# Patient Record
Sex: Female | Born: 1965 | Race: White | Hispanic: No | Marital: Married | Smoking: Never smoker
Health system: Southern US, Community
[De-identification: ages and names within clinical notes are randomized; demographics above are authoritative.]

## PROBLEM LIST (undated history)

## (undated) DIAGNOSIS — C801 Malignant (primary) neoplasm, unspecified: Secondary | ICD-10-CM

## (undated) DIAGNOSIS — S52502A Unspecified fracture of the lower end of left radius, initial encounter for closed fracture: Secondary | ICD-10-CM

## (undated) DIAGNOSIS — H8110 Benign paroxysmal vertigo, unspecified ear: Secondary | ICD-10-CM

## (undated) HISTORY — PX: COLONOSCOPY: SHX174

## (undated) HISTORY — PX: REDUCTION MAMMAPLASTY: SUR839

---

## 1984-01-27 HISTORY — PX: REDUCTION MAMMAPLASTY: SUR839

## 1997-01-26 HISTORY — PX: PORTACATH PLACEMENT: SHX2246

## 2016-05-27 ENCOUNTER — Other Ambulatory Visit: Payer: Self-pay | Admitting: Physician Assistant

## 2016-05-27 DIAGNOSIS — Z1231 Encounter for screening mammogram for malignant neoplasm of breast: Secondary | ICD-10-CM

## 2017-11-04 ENCOUNTER — Other Ambulatory Visit: Payer: Self-pay | Admitting: Family Medicine

## 2017-11-04 DIAGNOSIS — Z1231 Encounter for screening mammogram for malignant neoplasm of breast: Secondary | ICD-10-CM

## 2017-12-14 ENCOUNTER — Ambulatory Visit
Admission: RE | Admit: 2017-12-14 | Discharge: 2017-12-14 | Disposition: A | Discharge status: Home / Self Care | Payer: 59 | Source: Ambulatory Visit | Attending: Family Medicine | Admitting: Family Medicine

## 2017-12-14 DIAGNOSIS — Z1231 Encounter for screening mammogram for malignant neoplasm of breast: Secondary | ICD-10-CM

## 2018-04-15 ENCOUNTER — Other Ambulatory Visit: Payer: Self-pay | Admitting: Sports Medicine

## 2018-04-15 DIAGNOSIS — M25532 Pain in left wrist: Secondary | ICD-10-CM

## 2018-04-18 ENCOUNTER — Other Ambulatory Visit: Payer: Self-pay

## 2018-04-18 ENCOUNTER — Ambulatory Visit
Admission: RE | Admit: 2018-04-18 | Discharge: 2018-04-18 | Disposition: A | Discharge status: Home / Self Care | Payer: BLUE CROSS/BLUE SHIELD | Source: Ambulatory Visit | Attending: Sports Medicine | Admitting: Sports Medicine

## 2018-04-18 DIAGNOSIS — M25532 Pain in left wrist: Secondary | ICD-10-CM

## 2018-04-19 ENCOUNTER — Other Ambulatory Visit: Payer: Self-pay

## 2018-04-19 ENCOUNTER — Encounter (HOSPITAL_BASED_OUTPATIENT_CLINIC_OR_DEPARTMENT_OTHER): Payer: Self-pay | Admitting: *Deleted

## 2018-04-20 ENCOUNTER — Ambulatory Visit (HOSPITAL_BASED_OUTPATIENT_CLINIC_OR_DEPARTMENT_OTHER): Payer: BLUE CROSS/BLUE SHIELD | Admitting: Certified Registered"

## 2018-04-20 ENCOUNTER — Ambulatory Visit (HOSPITAL_BASED_OUTPATIENT_CLINIC_OR_DEPARTMENT_OTHER)
Admission: RE | Admit: 2018-04-20 | Discharge: 2018-04-20 | Disposition: A | Discharge status: Home / Self Care | Payer: BLUE CROSS/BLUE SHIELD | Attending: Orthopaedic Surgery | Admitting: Orthopaedic Surgery

## 2018-04-20 ENCOUNTER — Other Ambulatory Visit: Payer: Self-pay

## 2018-04-20 ENCOUNTER — Encounter (HOSPITAL_BASED_OUTPATIENT_CLINIC_OR_DEPARTMENT_OTHER)
Admission: RE | Disposition: A | Discharge status: Home / Self Care | Payer: Self-pay | Source: Home / Self Care | Attending: Orthopaedic Surgery

## 2018-04-20 DIAGNOSIS — Z923 Personal history of irradiation: Secondary | ICD-10-CM | POA: Diagnosis not present

## 2018-04-20 DIAGNOSIS — Z79899 Other long term (current) drug therapy: Secondary | ICD-10-CM | POA: Insufficient documentation

## 2018-04-20 DIAGNOSIS — Z8571 Personal history of Hodgkin lymphoma: Secondary | ICD-10-CM | POA: Insufficient documentation

## 2018-04-20 DIAGNOSIS — Z9221 Personal history of antineoplastic chemotherapy: Secondary | ICD-10-CM | POA: Insufficient documentation

## 2018-04-20 DIAGNOSIS — S52572A Other intraarticular fracture of lower end of left radius, initial encounter for closed fracture: Secondary | ICD-10-CM | POA: Insufficient documentation

## 2018-04-20 DIAGNOSIS — Z791 Long term (current) use of non-steroidal anti-inflammatories (NSAID): Secondary | ICD-10-CM | POA: Diagnosis not present

## 2018-04-20 HISTORY — DX: Benign paroxysmal vertigo, unspecified ear: H81.10

## 2018-04-20 HISTORY — DX: Malignant (primary) neoplasm, unspecified: C80.1

## 2018-04-20 HISTORY — DX: Unspecified fracture of the lower end of left radius, initial encounter for closed fracture: S52.502A

## 2018-04-20 HISTORY — PX: OPEN REDUCTION INTERNAL FIXATION (ORIF) DISTAL RADIAL FRACTURE: SHX5989

## 2018-04-20 SURGERY — OPEN REDUCTION INTERNAL FIXATION (ORIF) DISTAL RADIUS FRACTURE
Anesthesia: General | Site: Wrist | Laterality: Left

## 2018-04-20 MED ORDER — PROMETHAZINE HCL 25 MG/ML IJ SOLN: 6.2500 mg | INTRAMUSCULAR | Status: DC | PRN

## 2018-04-20 MED ORDER — CEFAZOLIN SODIUM-DEXTROSE 2-4 GM/100ML-% IV SOLN: 2.0000 g | INTRAVENOUS | Status: DC

## 2018-04-20 MED ORDER — LACTATED RINGERS IV SOLN
INTRAVENOUS | Status: DC
  Administered 2018-04-20: 10 mL/h via INTRAVENOUS
  Administered 2018-04-20: 10:00:00 via INTRAVENOUS

## 2018-04-20 MED ORDER — ONDANSETRON HCL 4 MG/2ML IJ SOLN
INTRAMUSCULAR | Status: AC
  Filled 2018-04-20: qty 2

## 2018-04-20 MED ORDER — MIDAZOLAM HCL 2 MG/2ML IJ SOLN
INTRAMUSCULAR | Status: AC
  Filled 2018-04-20: qty 2

## 2018-04-20 MED ORDER — PHENYLEPHRINE HCL 10 MG/ML IJ SOLN
INTRAMUSCULAR | Status: DC | PRN
  Administered 2018-04-20: 80 ug via INTRAVENOUS
  Administered 2018-04-20: 120 ug via INTRAVENOUS
  Administered 2018-04-20: 80 ug via INTRAVENOUS
  Administered 2018-04-20: 120 ug via INTRAVENOUS

## 2018-04-20 MED ORDER — OXYCODONE HCL 5 MG PO TABS: 5.0000 mg | ORAL_TABLET | Freq: Once | ORAL | Status: DC | PRN

## 2018-04-20 MED ORDER — FENTANYL CITRATE (PF) 100 MCG/2ML IJ SOLN
50.0000 ug | INTRAMUSCULAR | Status: DC | PRN
  Administered 2018-04-20: 25 ug via INTRAVENOUS
  Administered 2018-04-20: 100 ug via INTRAVENOUS

## 2018-04-20 MED ORDER — CEFAZOLIN SODIUM-DEXTROSE 2-3 GM-%(50ML) IV SOLR
INTRAVENOUS | Status: DC | PRN
  Administered 2018-04-20: 2 g via INTRAVENOUS

## 2018-04-20 MED ORDER — MEPERIDINE HCL 25 MG/ML IJ SOLN: 6.2500 mg | INTRAMUSCULAR | Status: DC | PRN

## 2018-04-20 MED ORDER — VANCOMYCIN HCL 1000 MG IV SOLR
INTRAVENOUS | Status: DC | PRN
  Administered 2018-04-20: 1000 mg

## 2018-04-20 MED ORDER — FENTANYL CITRATE (PF) 100 MCG/2ML IJ SOLN
INTRAMUSCULAR | Status: AC
  Filled 2018-04-20: qty 2

## 2018-04-20 MED ORDER — CHLORHEXIDINE GLUCONATE 4 % EX LIQD: 60.0000 mL | Freq: Once | CUTANEOUS | Status: DC

## 2018-04-20 MED ORDER — LIDOCAINE 2% (20 MG/ML) 5 ML SYRINGE
INTRAMUSCULAR | Status: AC
  Filled 2018-04-20: qty 5

## 2018-04-20 MED ORDER — ACETAMINOPHEN 500 MG PO TABS: 1000.0000 mg | ORAL_TABLET | Freq: Three times a day (TID) | ORAL | 0 refills | Status: AC

## 2018-04-20 MED ORDER — OXYCODONE HCL 5 MG/5ML PO SOLN: 5.0000 mg | Freq: Once | ORAL | Status: DC | PRN

## 2018-04-20 MED ORDER — OXYCODONE HCL 5 MG PO TABS: ORAL_TABLET | ORAL | 0 refills | Status: AC

## 2018-04-20 MED ORDER — PROPOFOL 10 MG/ML IV BOLUS
INTRAVENOUS | Status: AC
  Filled 2018-04-20: qty 40

## 2018-04-20 MED ORDER — HYDROMORPHONE HCL 1 MG/ML IJ SOLN: 0.2500 mg | INTRAMUSCULAR | Status: DC | PRN

## 2018-04-20 MED ORDER — MIDAZOLAM HCL 2 MG/2ML IJ SOLN
1.0000 mg | INTRAMUSCULAR | Status: DC | PRN
  Administered 2018-04-20: 2 mg via INTRAVENOUS

## 2018-04-20 MED ORDER — DEXAMETHASONE SODIUM PHOSPHATE 10 MG/ML IJ SOLN
INTRAMUSCULAR | Status: DC | PRN
  Administered 2018-04-20: 10 mg via INTRAVENOUS

## 2018-04-20 MED ORDER — ROPIVACAINE HCL 5 MG/ML IJ SOLN
INTRAMUSCULAR | Status: DC | PRN
  Administered 2018-04-20: 30 mL via PERINEURAL

## 2018-04-20 MED ORDER — DEXAMETHASONE SODIUM PHOSPHATE 10 MG/ML IJ SOLN
INTRAMUSCULAR | Status: AC
  Filled 2018-04-20: qty 1

## 2018-04-20 MED ORDER — CEFAZOLIN SODIUM-DEXTROSE 2-4 GM/100ML-% IV SOLN
INTRAVENOUS | Status: AC
  Filled 2018-04-20: qty 100

## 2018-04-20 MED ORDER — ONDANSETRON HCL 4 MG/2ML IJ SOLN
INTRAMUSCULAR | Status: DC | PRN
  Administered 2018-04-20: 4 mg via INTRAVENOUS

## 2018-04-20 MED ORDER — LIDOCAINE HCL (CARDIAC) PF 100 MG/5ML IV SOSY
PREFILLED_SYRINGE | INTRAVENOUS | Status: DC | PRN
  Administered 2018-04-20: 100 mg via INTRAVENOUS

## 2018-04-20 MED ORDER — ONDANSETRON HCL 4 MG PO TABS: 4.0000 mg | ORAL_TABLET | Freq: Three times a day (TID) | ORAL | 1 refills | Status: AC | PRN

## 2018-04-20 MED ORDER — PROPOFOL 10 MG/ML IV BOLUS
INTRAVENOUS | Status: DC | PRN
  Administered 2018-04-20: 200 mg via INTRAVENOUS

## 2018-04-20 MED ORDER — EPHEDRINE SULFATE 50 MG/ML IJ SOLN
INTRAMUSCULAR | Status: DC | PRN
  Administered 2018-04-20: 15 mg via INTRAVENOUS

## 2018-04-20 MED ORDER — CELECOXIB 200 MG PO CAPS: 200.0000 mg | ORAL_CAPSULE | Freq: Two times a day (BID) | ORAL | 0 refills | Status: AC

## 2018-04-20 MED ORDER — SCOPOLAMINE 1 MG/3DAYS TD PT72: 1.0000 | MEDICATED_PATCH | Freq: Once | TRANSDERMAL | Status: DC | PRN

## 2018-04-20 SURGICAL SUPPLY — 75 items
BANDAGE ACE 3X5.8 VEL STRL LF (GAUZE/BANDAGES/DRESSINGS) ×3 IMPLANT
BANDAGE ACE 4X5 VEL STRL LF (GAUZE/BANDAGES/DRESSINGS) ×3 IMPLANT
BENZOIN TINCTURE PRP APPL 2/3 (GAUZE/BANDAGES/DRESSINGS) ×3 IMPLANT
BIT DRILL 2.2 SS TIBIAL (BIT) ×3 IMPLANT
BLADE HEX COATED 2.75 (ELECTRODE) ×3 IMPLANT
BLADE SURG 15 STRL LF DISP TIS (BLADE) ×1 IMPLANT
BLADE SURG 15 STRL SS (BLADE) ×2
BNDG COHESIVE 4X5 TAN STRL (GAUZE/BANDAGES/DRESSINGS) ×3 IMPLANT
BNDG ESMARK 4X9 LF (GAUZE/BANDAGES/DRESSINGS) ×3 IMPLANT
BRUSH SCRUB EZ PLAIN DRY (MISCELLANEOUS) ×3 IMPLANT
CANISTER SUCT 1200ML W/VALVE (MISCELLANEOUS) IMPLANT
CHLORAPREP W/TINT 26 (MISCELLANEOUS) IMPLANT
CLOSURE WOUND 1/2 X4 (GAUZE/BANDAGES/DRESSINGS) ×1
COVER BACK TABLE 60X90IN (DRAPES) ×3 IMPLANT
COVER WAND RF STERILE (DRAPES) IMPLANT
CUFF TOURN SGL QUICK 18X4 (TOURNIQUET CUFF) ×3 IMPLANT
DECANTER SPIKE VIAL GLASS SM (MISCELLANEOUS) IMPLANT
DRAPE EXTREMITY T 121X128X90 (DISPOSABLE) ×3 IMPLANT
DRAPE IMP U-DRAPE 54X76 (DRAPES) ×3 IMPLANT
DRAPE INCISE IOBAN 66X45 STRL (DRAPES) IMPLANT
DRAPE OEC MINIVIEW 54X84 (DRAPES) ×3 IMPLANT
DRAPE SURG 17X23 STRL (DRAPES) ×3 IMPLANT
DRSG EMULSION OIL 3X3 NADH (GAUZE/BANDAGES/DRESSINGS) IMPLANT
ELECT REM PT RETURN 9FT ADLT (ELECTROSURGICAL) ×3
ELECTRODE REM PT RTRN 9FT ADLT (ELECTROSURGICAL) ×1 IMPLANT
GAUZE SPONGE 4X4 12PLY STRL (GAUZE/BANDAGES/DRESSINGS) ×3 IMPLANT
GLOVE BIOGEL PI IND STRL 7.0 (GLOVE) ×2 IMPLANT
GLOVE BIOGEL PI IND STRL 8 (GLOVE) ×1 IMPLANT
GLOVE BIOGEL PI INDICATOR 7.0 (GLOVE) ×4
GLOVE BIOGEL PI INDICATOR 8 (GLOVE) ×2
GLOVE ECLIPSE 6.5 STRL STRAW (GLOVE) ×3 IMPLANT
GLOVE ECLIPSE 8.0 STRL XLNG CF (GLOVE) ×3 IMPLANT
GLOVE SURG SS PI 6.5 STRL IVOR (GLOVE) ×3 IMPLANT
GOWN STRL REUS W/ TWL LRG LVL3 (GOWN DISPOSABLE) ×2 IMPLANT
GOWN STRL REUS W/ TWL XL LVL3 (GOWN DISPOSABLE) IMPLANT
GOWN STRL REUS W/TWL LRG LVL3 (GOWN DISPOSABLE) ×4
GOWN STRL REUS W/TWL XL LVL3 (GOWN DISPOSABLE) ×3 IMPLANT
K-WIRE 1.6 (WIRE) ×6
K-WIRE FX5X1.6XNS BN SS (WIRE) ×3
KWIRE FX5X1.6XNS BN SS (WIRE) ×3 IMPLANT
NEEDLE HYPO 25X1 1.5 SAFETY (NEEDLE) IMPLANT
NS IRRIG 1000ML POUR BTL (IV SOLUTION) ×3 IMPLANT
PACK BASIN DAY SURGERY FS (CUSTOM PROCEDURE TRAY) ×3 IMPLANT
PAD CAST 4YDX4 CTTN HI CHSV (CAST SUPPLIES) ×1 IMPLANT
PADDING CAST COTTON 4X4 STRL (CAST SUPPLIES) ×2
PEG LOCKING SMOOTH 2.2X18 (Peg) ×12 IMPLANT
PEG LOCKING SMOOTH 2.2X20 (Screw) ×9 IMPLANT
PEG LOCKING SMOOTH 2.2X22 (Screw) ×3 IMPLANT
PENCIL BUTTON HOLSTER BLD 10FT (ELECTRODE) ×3 IMPLANT
PLATE VOLAR RIM L ST (Plate) ×3 IMPLANT
SCREW 2.7X14MM (Screw) ×2 IMPLANT
SCREW BN 14X2.7XNONLOCK 3 LD (Screw) ×1 IMPLANT
SCREW LOCK 16X2.7X 3 LD TPR (Screw) ×1 IMPLANT
SCREW LOCKING 2.7X15MM (Screw) ×6 IMPLANT
SCREW LOCKING 2.7X16 (Screw) ×2 IMPLANT
SCREW NLOCK 24X2.7 3 LD (Screw) ×1 IMPLANT
SCREW NONLOCK 2.7X24 (Screw) ×2 IMPLANT
SLEEVE SCD COMPRESS KNEE MED (MISCELLANEOUS) ×3 IMPLANT
SLING ARM FOAM STRAP MED (SOFTGOODS) ×3 IMPLANT
SPLINT PLASTER CAST XFAST 3X15 (CAST SUPPLIES) ×10 IMPLANT
SPLINT PLASTER XTRA FASTSET 3X (CAST SUPPLIES) ×20
SPONGE LAP 4X18 RFD (DISPOSABLE) IMPLANT
STRIP CLOSURE SKIN 1/2X4 (GAUZE/BANDAGES/DRESSINGS) ×2 IMPLANT
SUCTION FRAZIER HANDLE 10FR (MISCELLANEOUS) ×2
SUCTION TUBE FRAZIER 10FR DISP (MISCELLANEOUS) ×1 IMPLANT
SUT MNCRL AB 4-0 PS2 18 (SUTURE) ×3 IMPLANT
SUT VIC AB 3-0 SH 27 (SUTURE)
SUT VIC AB 3-0 SH 27X BRD (SUTURE) IMPLANT
SYR BULB 3OZ (MISCELLANEOUS) ×3 IMPLANT
SYR CONTROL 10ML LL (SYRINGE) IMPLANT
TOWEL GREEN STERILE FF (TOWEL DISPOSABLE) ×3 IMPLANT
TRAY DSU PREP LF (CUSTOM PROCEDURE TRAY) ×3 IMPLANT
TUBE CONNECTING 20'X1/4 (TUBING) ×1
TUBE CONNECTING 20X1/4 (TUBING) ×2 IMPLANT
YANKAUER SUCT BULB TIP NO VENT (SUCTIONS) IMPLANT

## 2018-04-20 NOTE — H&P (Signed)
PREOPERATIVE H&P  Chief Complaint: LEFT WRIST FRACTURE  HPI: Ann Singh is a 53 y.o. female who presents for preoperative history and physical with a diagnosis of LEFT WRIST FRACTURE. Symptoms are rated as moderate to severe, and have been worsening.  This is significantly impairing activities of daily living.  Please see my clinic note for full details on this patient's care.  She has elected for surgical management.   Past Medical History:  Diagnosis Date  . BPPV (benign paroxysmal positional vertigo)    corrected with Eply maneuvers  . Cancer [facility])    age 52 diagnosed with Hodgkin's lymphoma-chemo and radiation-no no problems since treatment finished  . Closed fracture of left distal radius happened 04-13-18   Past Surgical History:  Procedure Laterality Date  . COLONOSCOPY    . [place]   for lymphoma  . REDUCTION MAMMAPLASTY Bilateral    in her 20's   Social History   Socioeconomic History  . Marital status: Married    Spouse name: Not on file  . Number of children: Not on file  . Years of education: Not on file  . Highest education level: Not on file  Occupational History  . Not on file  Social Needs  . Financial resource strain: Not on file  . Food insecurity:    Worry: Not on file    Inability: Not on file  . Transportation needs:    Medical: Not on file    Non-medical: Not on file  Tobacco Use  . Smoking status: Never Smoker  . Smokeless tobacco: Never Used  Substance and Sexual Activity  . Alcohol use: Yes    Comment: social  . Drug use: Never  . Sexual activity: Yes    Birth control/protection: Post-menopausal  Lifestyle  . Physical activity:    Days per week: Not on file    Minutes per session: Not on file  . Stress: Not on file  Relationships  . Social connections:    Talks on phone: Not on file    Gets together: Not on file    Attends religious service: Not on file    Active member of club or organization: Not on  file    Attends meetings of clubs or organizations: Not on file    Relationship status: Not on file  Other Topics Concern  . Not on file  Social History Narrative  . Not on file   History reviewed. No pertinent family history. No Known Allergies Prior to Admission medications   Medication Sig Start Date End Date Taking? Authorizing Provider  Biotin 1000 MCG CHEW Chew by mouth.   Yes [provider]  HYDROcodone-acetaminophen (NORCO/VICODIN) 5-325 MG tablet Take 1 tablet by mouth every 6 (six) hours as needed for moderate pain.   Yes [provider]  ibuprofen (ADVIL,MOTRIN) 600 MG tablet Take 600 mg by mouth every 6 (six) hours as needed.   Yes [provider]  meloxicam (MOBIC) 15 MG tablet Take 15 mg by mouth daily.   Yes [provider]  Multiple Vitamin (MULTIVITAMIN WITH MINERALS) TABS tablet Take 1 tablet by mouth daily.   Yes [provider]  vitamin C (ASCORBIC ACID) 500 MG tablet Take 500 mg by mouth daily.   Yes [provider]     Positive ROS: All other systems have been reviewed and were otherwise negative with the exception of those mentioned in the HPI and as above.  Physical Exam: General: Alert, no  acute distress Cardiovascular: No pedal edema Respiratory: No cyanosis, no use of accessory musculature GI: No organomegaly, abdomen is soft and non-tender Skin: No lesions in the area of chief complaint Neurologic: Sensation intact distally Psychiatric: Patient is competent for consent with normal mood and affect Lymphatic: No axillary or cervical lymphadenopathy  MUSCULOSKELETAL: L wrist, ttp around wrist, distal motor and sensory intact,  Assessment: LEFT WRIST FRACTURE  Plan: Plan for Procedure(s): LEFT OPEN REDUCTION INTERNAL FIXATION (ORIF) DISTAL RADIAL FRACTURE  The risks benefits and alternatives were discussed with the patient including but not limited to the risks of nonoperative treatment, versus  surgical intervention including infection, bleeding, nerve injury,  blood clots, cardiopulmonary complications, morbidity, mortality, among others, and they were willing to proceed.   Hiram Gash, MD  04/20/2018 9:14 AM

## 2018-04-20 NOTE — Anesthesia Procedure Notes (Signed)
Anesthesia Regional Block: Supraclavicular block   Pre-Anesthetic Checklist: ,, timeout performed, Correct Patient, Correct Site, Correct Laterality, Correct Procedure, Correct Position, site marked, Risks and benefits discussed,  Surgical consent,  Pre-op evaluation,  At surgeon's request and post-op pain management  Laterality: Left  Prep: chloraprep       Needles:  Injection technique: Single-shot  Needle Type: Stimiplex     Needle Length: 9cm  Needle Gauge: 21     Additional Needles:   Procedures:,,,, ultrasound used (permanent image in chart),,,,  Narrative:  Start time: 04/20/2018 8:45 AM End time: 04/20/2018 8:50 AM Injection made incrementally with aspirations every 5 mL.  Performed by: Personally  Anesthesiologist: Lynda Rainwater, MD

## 2018-04-20 NOTE — Anesthesia Postprocedure Evaluation (Signed)
Anesthesia Post Note  Patient: Kayelyn Lemon Haun Gerads  Procedure(s) Performed: LEFT OPEN REDUCTION INTERNAL FIXATION (ORIF) DISTAL RADIAL FRACTURE (Left Wrist)     Patient location during evaluation: PACU Anesthesia Type: General Level of consciousness: awake and alert Pain management: pain level controlled Vital Signs Assessment: post-procedure vital signs reviewed and stable Respiratory status: spontaneous breathing, nonlabored ventilation and respiratory function stable Cardiovascular status: blood pressure returned to baseline and stable Postop Assessment: no apparent nausea or vomiting Anesthetic complications: no    Last Vitals:  Vitals:   04/20/18 1200 04/20/18 1230  BP: (!) 138/92 (!) 144/89  Pulse: (!) 104 100  Resp: 18 18  Temp:  36.6 C  SpO2: 95% 96%    Last Pain:  Vitals:   04/20/18 1230  TempSrc:   PainSc: 0-No pain                 Lynda Rainwater

## 2018-04-20 NOTE — Op Note (Signed)
Orthopaedic Surgery Operative Note (CSN: [id number])  Ann Singh  May 22, 1965 Date of Surgery: 04/20/2018   Diagnoses:  Left intraarticular distal radius fracture  Procedure: Left 4 part intraarticular distal radius ORIF   Operative Finding Successful completion of planned procedure.    Post-operative plan: The patient will be NWB in splint.  The patient will be DC home.  DVT prophylaxis not indicated in isolated upper extremity surgery patient with no specific risks factors.  Pain control with PRN pain medication preferring oral medicines.  Follow up plan will be scheduled in approximately 7 days for incision check and XR.  Removable splint likely with hand to mouth activities only for 6 weeks.  Post-Op Diagnosis: Same Surgeons:Primary: Hiram Gash, MD Assistants:Brandon Lynnell Jude Location: [facility] OR ROOM 2 Anesthesia: General Antibiotics: Ancef 2g preop, Vancomycin 1000mg  locally  Tourniquet time:  Total Tourniquet Time Documented: Upper Arm (Left) - 55 minutes Total: Upper Arm (Left) - 55 minutes  Estimated Blood Loss:minimal Complications: None Specimens: None Implants: Implant Name Type Inv. Item Serial No. Manufacturer Lot No. LRB No. Used Action  DVR volar rim plate   [id number] BI[employer]ET [employer]TH[employer] [id number] Left 1 Implanted  PEG LOCKING SMOOTH 2.2X22 - [id number] Screw PEG LOCKING SMOOTH 2.2X22  ZIMMER RECON(ORTH,TRAU,BIO,SG) ON SET Left 1 Implanted  PEG LOCKING SMOOTH 2.2X18 - [id number] Peg PEG LOCKING SMOOTH 2.2X18  ZIMMER RECON(ORTH,TRAU,BIO,SG) ON SET Left 4 Implanted  PEG LOCKING SMOOTH 2.2X20 - [id number] Screw PEG LOCKING SMOOTH 2.2X20  ZIMMER RECON(ORTH,TRAU,BIO,SG) ON SET Left 3 Implanted  SCREW LOCKING 2.7X15MM - [id number] Screw SCREW LOCKING 2.7X15MM  ZIMMER RECON(ORTH,TRAU,BIO,SG) ON SET Left 2 Implanted  SCREW LOCKING 2.7X16 - [id number] Screw SCREW LOCKING 2.7X16  ZIMMER RECON(ORTH,TRAU,BIO,SG) ON SET Left 1 Implanted    Indications for Surgery:    Ann Singh is a 53 y.o. female with fall off a hoverboard.  She had a Ct of her wrist demonstrating a 4 part intraarticular distal radius fracture.  Benefits and risks of operative and nonoperative management were discussed prior to surgery with patient/guardian(s) and informed consent form was completed.  Specific risks including infection, need for additional surgery,    Procedure:   The patient was identified in the preoperative holding area where the surgical site was marked. The patient was taken to the OR where a procedural timeout was called and the above noted anesthesia was induced.  The patient was positioned supine on a hand table.  Preoperative antibiotics were dosed.  The patient's left wrist was prepped and draped in the usual sterile fashion.  A second preoperative timeout was called.      A tourniquet was used for the above listed time.  An FCR approach was made exposing the volar surface of the distal radius taking care to go through the sheath of the FCR tendon tract and ulnarly exposing the inferior portion of the sheath while protecting the median nerve and radial artery on each side with blunt retractors.  This inferior portion of the sheath was incised sharply and examined for presence of the palmar cutaneous branch of the median nerve.  It was determined to not be within the field and we carried our dissection deeply to the bone splitting the pronator quadratus and exposing the fracture site.    Appropriate reduction was obtained and a volar rim standard DVR Biomet plate was placed and checked for sizing and reduction under fluoroscopy.  This reduction was held in place with K wires and a K wire  was placed into the radial styloid.  We used a freer elevator to elevate the depressed die punch portion of the fracture anatomically checking position under fluoro.    Once appropriate reduction was confirmed we then proceeded to fix the plate proximally and then proceeded  to fill the distal holes with a combination of partially threaded screws and pegs.  At this point we checked our reduction to ensure that there was no intra-articular extension of our screws.  Once this was confirmed we proceeded to fill remaining 3 proximal shaft screws and obtained final images which demonstrated appropriate reduction and maintenance of alignment.  The DRUJ was checked and found to be stable.  We verified that all fast guides were removed on XR and through count.    The wound was thoroughly irrigated.  The tourniquet was released prior to skin closure to verify there was no excessive bleeding and we visualized that the radial artery and median nerve were intact at the end of the case. The PQ was reapproximated grossly prior to skin closure.    The incision was thoroughly irrigated and closed in a multilayer fashion with absorbable sutures. A sterile dressing was placed.  A demi-splint was placed. The patient was awoken from general anesthesia and taken to the PACU in stable condition without complication.

## 2018-04-20 NOTE — Transfer of Care (Signed)
Immediate Anesthesia Transfer of Care Note  Patient: Ann Singh  Procedure(s) Performed: LEFT OPEN REDUCTION INTERNAL FIXATION (ORIF) DISTAL RADIAL FRACTURE (Left Wrist)  Patient Location: PACU  Anesthesia Type:GA combined with regional for post-op pain  Level of Consciousness: alert , sedated and patient cooperative  Airway & Oxygen Therapy: Patient Spontanous Breathing and Patient connected to face mask oxygen  Post-op Assessment: Report given to RN and Post -op Vital signs reviewed and stable  Post vital signs: Reviewed and stable  Last Vitals:  Vitals Value Taken Time  BP 128/72 04/20/2018 11:37 AM  Temp    Pulse 98 04/20/2018 11:38 AM  Resp 16 04/20/2018 11:38 AM  SpO2 97 % 04/20/2018 11:38 AM    Last Pain:  Vitals:   04/20/18 0845  TempSrc:   PainSc: 0-No pain      Patients Stated Pain Goal: 2 (35/68/61 6837)  Complications: No apparent anesthesia complications

## 2018-04-20 NOTE — Progress Notes (Signed)
Assisted Dr. Miller with left, ultrasound guided, supraclavicular block. Side rails up, monitors on throughout procedure. See vital signs in flow sheet. Tolerated Procedure well. 

## 2018-04-20 NOTE — Anesthesia Procedure Notes (Signed)
Procedure Name: LMA Insertion Performed by: Matrice Herro M, CRNA Pre-anesthesia Checklist: Patient identified, Emergency Drugs available, Suction available, Patient being monitored and Timeout performed Patient Re-evaluated:Patient Re-evaluated prior to induction Oxygen Delivery Method: Circle system utilized Preoxygenation: Pre-oxygenation with 100% oxygen Induction Type: IV induction LMA: LMA inserted LMA Size: 4.0 Number of attempts: 1 Placement Confirmation: positive ETCO2,  CO2 detector and breath sounds checked- equal and bilateral Tube secured with: Tape Dental Injury: Teeth and Oropharynx as per pre-operative assessment        

## 2018-04-20 NOTE — Discharge Instructions (Signed)

## 2018-04-20 NOTE — Anesthesia Preprocedure Evaluation (Signed)
Anesthesia Evaluation  Patient identified by MRN, date of birth, ID band Patient awake    Reviewed: Allergy & Precautions, NPO status , Patient's Chart, lab work & pertinent test results  Airway Mallampati: II  TM Distance: >3 FB Neck ROM: Full    Dental no notable dental hx.    Pulmonary neg pulmonary ROS,    Pulmonary exam normal breath sounds clear to auscultation       Cardiovascular negative cardio ROS Normal cardiovascular exam Rhythm:Regular Rate:Normal     Neuro/Psych negative neurological ROS  negative psych ROS   GI/Hepatic negative GI ROS, Neg liver ROS,   Endo/Other  negative endocrine ROS  Renal/GU negative Renal ROS  negative genitourinary   Musculoskeletal negative musculoskeletal ROS (+)   Abdominal (+) + obese,   Peds negative pediatric ROS (+)  Hematology negative hematology ROS (+)   Anesthesia Other Findings   Reproductive/Obstetrics negative OB ROS                             Anesthesia Physical Anesthesia Plan  ASA: II  Anesthesia Plan: General   Post-op Pain Management: GA combined w/ Regional for post-op pain   Induction: Intravenous  PONV Risk Score and Plan: 3 and Ondansetron, Dexamethasone and Midazolam  Airway Management Planned: LMA  Additional Equipment:   Intra-op Plan:   Post-operative Plan: Extubation in OR  Informed Consent: I have reviewed the patients History and Physical, chart, labs and discussed the procedure including the risks, benefits and alternatives for the proposed anesthesia with the patient or authorized representative who has indicated his/her understanding and acceptance.     Dental advisory given  Plan Discussed with: CRNA  Anesthesia Plan Comments:         Anesthesia Quick Evaluation

## 2018-04-21 ENCOUNTER — Encounter (HOSPITAL_BASED_OUTPATIENT_CLINIC_OR_DEPARTMENT_OTHER): Payer: Self-pay | Admitting: Orthopaedic Surgery

## 2018-11-08 ENCOUNTER — Other Ambulatory Visit: Payer: Self-pay | Admitting: Family Medicine

## 2018-11-08 DIAGNOSIS — Z1231 Encounter for screening mammogram for malignant neoplasm of breast: Secondary | ICD-10-CM

## 2018-12-21 ENCOUNTER — Other Ambulatory Visit: Payer: Self-pay

## 2018-12-21 ENCOUNTER — Ambulatory Visit
Admission: RE | Admit: 2018-12-21 | Discharge: 2018-12-21 | Disposition: A | Discharge status: Home / Self Care | Payer: BLUE CROSS/BLUE SHIELD | Source: Ambulatory Visit | Attending: Family Medicine | Admitting: Family Medicine

## 2018-12-21 DIAGNOSIS — Z1231 Encounter for screening mammogram for malignant neoplasm of breast: Secondary | ICD-10-CM

## 2019-06-30 ENCOUNTER — Emergency Department (HOSPITAL_COMMUNITY)
Admission: EM | Admit: 2019-06-30 | Discharge: 2019-06-30 | Disposition: A | Discharge status: Home / Self Care | Payer: BC Managed Care – PPO | Attending: Emergency Medicine | Admitting: Emergency Medicine

## 2019-06-30 ENCOUNTER — Other Ambulatory Visit: Payer: Self-pay

## 2019-06-30 DIAGNOSIS — Z923 Personal history of irradiation: Secondary | ICD-10-CM | POA: Insufficient documentation

## 2019-06-30 DIAGNOSIS — T63441A Toxic effect of venom of bees, accidental (unintentional), initial encounter: Secondary | ICD-10-CM

## 2019-06-30 DIAGNOSIS — R000 Tachycardia, unspecified: Secondary | ICD-10-CM | POA: Insufficient documentation

## 2019-06-30 DIAGNOSIS — L500 Allergic urticaria: Secondary | ICD-10-CM | POA: Diagnosis not present

## 2019-06-30 DIAGNOSIS — R220 Localized swelling, mass and lump, head: Secondary | ICD-10-CM | POA: Diagnosis not present

## 2019-06-30 DIAGNOSIS — R42 Dizziness and giddiness: Secondary | ICD-10-CM | POA: Diagnosis not present

## 2019-06-30 DIAGNOSIS — Z8571 Personal history of Hodgkin lymphoma: Secondary | ICD-10-CM | POA: Diagnosis not present

## 2019-06-30 DIAGNOSIS — Z9221 Personal history of antineoplastic chemotherapy: Secondary | ICD-10-CM | POA: Insufficient documentation

## 2019-06-30 DIAGNOSIS — L539 Erythematous condition, unspecified: Secondary | ICD-10-CM | POA: Diagnosis not present

## 2019-06-30 DIAGNOSIS — T782XXA Anaphylactic shock, unspecified, initial encounter: Secondary | ICD-10-CM

## 2019-06-30 MED ORDER — PREDNISONE 20 MG PO TABS: 40.0000 mg | ORAL_TABLET | Freq: Every day | ORAL | 0 refills | Status: AC

## 2019-06-30 MED ORDER — FAMOTIDINE IN NACL 20-0.9 MG/50ML-% IV SOLN
20.0000 mg | Freq: Once | INTRAVENOUS | Status: AC
Start: 2019-06-30 — End: 2019-06-30
  Administered 2019-06-30: 20 mg via INTRAVENOUS
  Filled 2019-06-30: qty 50

## 2019-06-30 MED ORDER — EPINEPHRINE 0.3 MG/0.3ML IJ SOAJ: INTRAMUSCULAR | 1 refills | Status: DC

## 2019-06-30 NOTE — ED Provider Notes (Signed)
[place] DEPT Provider Note   CSN: [id number] Arrival date & time: 06/30/19  1645     History Chief Complaint  Patient presents with  . Allergic Reaction    Bee Sting     Ann Singh is a 54 y.o. female with a past medical history of Hodgkin's lymphoma, vertigo, who presents today for evaluation after a bee sting. She reports that she was at home moving the chickens wearing sandals and was stung by a bee on her left fourth toe. She reports she immediately started itching, her skin turn red, and she felt like she was on fire. She reports that this progressed to feeling like her tongue was swelling and thick. She went to [facility] primary care who called 911. According to records from [place] they gave her 2 separate EpiPen Junior's. She was given 125 mg of Solu-Medrol. She states she took 50 mg of Benadryl at home without vomiting. Reports that currently she feels like her skin is less burning, and her tongue swelling has resolved. She denies any chest pain cough or shortness of breath. She has never had a similar allergic reaction in the past and does not have EpiPen's at home. She states that her husband pulled the stinger out of her foot.  Currently still feels slightly itchy.   HPI     Past Medical History:  Diagnosis Date  . BPPV (benign paroxysmal positional vertigo)    corrected with Eply maneuvers  . Cancer [facility])    age 37 diagnosed with Hodgkin's lymphoma-chemo and radiation-no no problems since treatment finished  . Closed fracture of left distal radius happened 04-13-18    There are no problems to display for this patient.   Past Surgical History:  Procedure Laterality Date  . COLONOSCOPY    . OPEN REDUCTION INTERNAL FIXATION (ORIF) DISTAL RADIAL FRACTURE Left 04/20/2018   Procedure: LEFT OPEN REDUCTION INTERNAL FIXATION (ORIF) DISTAL RADIAL FRACTURE;  Surgeon: Hiram Gash, MD;  Location: [place];  Service:  Orthopedics;  Laterality: Left;  . [place]   for lymphoma  . REDUCTION MAMMAPLASTY Bilateral    in her 20's     OB History   No obstetric history on file.     No family history on file.  Social History   Tobacco Use  . Smoking status: Never Smoker  . Smokeless tobacco: Never Used  Substance Use Topics  . Alcohol use: Yes    Comment: social  . Drug use: Never    Home Medications Prior to Admission medications   Medication Sig Start Date End Date Taking? Authorizing Provider  Biotin 1000 MCG CHEW Chew by mouth.    [provider]  EPINEPHrine 0.3 mg/0.3 mL IJ SOAJ injection Inject one device into the side of the thigh for severe allergic reactions.  May repeat with second device in 5-10 minutes if needed.  After using call 9-11. 06/30/19   Lorin Glass, PA-C  Multiple Vitamin (MULTIVITAMIN WITH MINERALS) TABS tablet Take 1 tablet by mouth daily.    [provider]  predniSONE (DELTASONE) 20 MG tablet Take 2 tablets (40 mg total) by mouth daily for 3 days. 06/30/19 07/03/19  Lorin Glass, PA-C  vitamin C (ASCORBIC ACID) 500 MG tablet Take 500 mg by mouth daily.    [provider]    Allergies    Patient has no known allergies.  Review of Systems   Review of Systems  Constitutional:  Negative for chills and fever.  Respiratory: Negative for cough and shortness of breath.   Cardiovascular: Negative for chest pain.  Gastrointestinal: Negative for abdominal pain, diarrhea, nausea and vomiting.  Musculoskeletal: Negative for back pain.  Skin: Positive for color change and rash.  Neurological: Negative for syncope, weakness and headaches.  All other systems reviewed and are negative.   Physical Exam Updated Vital Signs BP (!) 159/89   Pulse (!) 103   Temp 98.1 F (36.7 C)   Resp 16   SpO2 94%   Physical Exam Vitals and nursing note reviewed.  Constitutional:      General: She is not in acute distress.     Appearance: She is well-developed. She is not diaphoretic.     Comments: Scratching her arms and legs.   HENT:     Head: Normocephalic and atraumatic.     Comments: No oropharyngeal swelling.  Eyes:     General: No scleral icterus.       Right eye: No discharge.        Left eye: No discharge.     Conjunctiva/sclera: Conjunctivae normal.  Cardiovascular:     Rate and Rhythm: Regular rhythm. Tachycardia present.  Pulmonary:     Effort: Pulmonary effort is normal. No respiratory distress.     Breath sounds: Normal breath sounds. No stridor.  Abdominal:     General: There is no distension.     Tenderness: There is no guarding.  Musculoskeletal:        General: No deformity.     Cervical back: Normal range of motion and neck supple. No rigidity.  Skin:    General: Skin is warm and dry.     Findings: Rash present. Rash is urticarial.     Comments: Left 4th toe has a pinpoint area of oozing, was gently scraped after chx with no stinger fragments visualized. Skin is red.   Neurological:     General: No focal deficit present.     Mental Status: She is alert and oriented to person, place, and time.     Motor: No abnormal muscle tone.  Psychiatric:        Mood and Affect: Mood normal.        Behavior: Behavior normal.     ED Results / Procedures / Treatments   Labs (all labs ordered are listed, but only abnormal results are displayed) Labs Reviewed - No data to display  EKG None  Radiology No results found.  Procedures Procedures (including critical care time)  Medications Ordered in ED Medications  famotidine (PEPCID) IVPB 20 mg premix (0 mg Intravenous Stopped 06/30/19 1747)    ED Course  I have reviewed the triage vital signs and the nursing notes.  Pertinent labs & imaging results that were available during my care of the patient were reviewed by me and considered in my medical decision making (see chart for details).  Clinical Course as of Jun 30 1802  Fri Jun 30, 2019  1704 Benadryl at home Epi at 355, 405, solumederol at 4pm (125mg )   [EH]    Clinical Course User Index [EH] Ollen Gross   MDM Rules/Calculators/A&P                     Patient is a 54 year old woman who presents today for evaluation of allergic reaction after bee sting. She was initially evaluated at an outpatient primary care doctor's office where she was given 2 EpiPen Junior's, and  Solu-Medrol. She took Benadryl prior to arrival. She is given IV famotidine for H2 blocker. Plan to observe patient in the emergency room for repeat reaction. At the time of my evaluation she still feels slightly itchy with slightly erythematous skin however reports she has overall improved.    At shift change care was transferred to [facility] PA-C who will follow pending studies, re-evaulate and determine disposition.    Final Clinical Impression(s) / ED Diagnoses Final diagnoses:  Anaphylaxis, initial encounter  Anaphylactic reaction to bee sting, accidental or unintentional, initial encounter    Rx / DC Orders ED Discharge Orders         Ordered    EPINEPHrine 0.3 mg/0.3 mL IJ SOAJ injection     06/30/19 1803    predniSONE (DELTASONE) 20 MG tablet  Daily     06/30/19 1803           Lorin Glass, Hershal Coria 06/30/19 1804    Wyvonnia Dusky, MD 07/01/19 [phone]

## 2019-06-30 NOTE — ED Notes (Signed)
PT states itching has subsided, she feels good and is ready to go home. Provider notified. Pt was provided with sandwich and drink as well.

## 2019-06-30 NOTE — ED Notes (Signed)
Pt ambulated to and from restroom without difficulty prior to discharge

## 2019-06-30 NOTE — ED Provider Notes (Signed)
Care handoff received from Wyn Quaker, PA-C at shift change please see previous providers note for full details.  "Patient is a 54 year old woman who presents today for evaluation of allergic reaction after bee sting. She was initially evaluated at an outpatient primary care doctor's office where she was given 2 EpiPen Junior's, and Solu-Medrol. She took Benadryl prior to arrival. She is given IV famotidine for H2 blocker. Plan to observe patient in the emergency room for repeat reaction. At the time of my evaluation she still feels slightly itchy with slightly erythematous skin however reports she has overall improved.    At shift change care was transferred to [facility] PA-C who will follow pending studies, re-evaulate and determine disposition. "  Physical Exam  BP (!) 151/80 (BP Location: Right Arm)   Pulse (!) 102   Temp 98.1 F (36.7 C)   Resp 13   SpO2 97%   Physical Exam Constitutional:      General: She is not in acute distress.    Appearance: Normal appearance. She is well-developed. She is not ill-appearing or diaphoretic.  HENT:     Head: Normocephalic and atraumatic.     Mouth/Throat:     Comments: The patient has normal phonation and is in control of secretions. No stridor.  Midline uvula without edema. Soft palate rises symmetrically. No tonsillar erythema, swelling or exudates. Tongue protrusion is normal, floor of mouth is soft. No trismus. No creptius on neck palpation. No gingival erythema or fluctuance noted. Mucus membranes moist. No pallor noted. Eyes:     General: Vision grossly intact. Gaze aligned appropriately.     Pupils: Pupils are equal, round, and reactive to light.  Neck:     Trachea: Trachea and phonation normal.  Pulmonary:     Effort: Pulmonary effort is normal. No respiratory distress.  Abdominal:     General: There is no distension.     Palpations: Abdomen is soft.     Tenderness: There is no abdominal tenderness. There is no guarding  or rebound.  Musculoskeletal:        General: Normal range of motion.     Cervical back: Normal range of motion.  Skin:    General: Skin is warm and dry.  Neurological:     Mental Status: She is alert.     GCS: GCS eye subscore is 4. GCS verbal subscore is 5. GCS motor subscore is 6.     Comments: Speech is clear and goal oriented, follows commands Major Cranial nerves without deficit, no facial droop Moves extremities without ataxia, coordination intact  Psychiatric:        Behavior: Behavior normal.     ED Course/Procedures   Clinical Course as of Jun 29 2001  Fri Jun 30, 2019  1704 Benadryl at home Epi at 355, 405, solumederol at 4pm (125mg )   [EH]    Clinical Course User Index [EH] Lorin Glass, PA-C    Procedures  MDM  8:40 PM: Patient reevaluated she is resting comfortably in bed no acute distress family member at bedside.  She reports complete cessation of her symptoms reports she is feeling well and would like to go home.  Physical examination is reassuring, airway is clear.  She was prescribed prednisone and EpiPen by previous provider.  She has been observed over 4 hours since receiving epinephrine without recurrent reaction.  She is ambulating around the emergency department without assistance or difficulty.  Appears stable for discharge.  Vital signs stable on room  air.  I discussed in detail return precautions with patient and she and her family understand understanding.  She will continue using Benadryl at home.  AVS and prescriptions were completed by previous provider, printed and given to patient.  At this time there does not appear to be any evidence of an acute emergency medical condition and the patient appears stable for discharge with appropriate outpatient follow up. Diagnosis was discussed with patient who verbalizes understanding of care plan and is agreeable to discharge. I have discussed return precautions with patient and family who verbalizes  understanding. Patient encouraged to follow-up with their PCP. All questions answered.   Note: Portions of this report may have been transcribed using voice recognition software. Every effort was made to ensure accuracy; however, inadvertent computerized transcription errors may still be present.   Gari Crown 06/30/19 2048    Wyvonnia Dusky, MD 07/01/19 [phone]

## 2019-06-30 NOTE — Discharge Instructions (Addendum)
Today you were seen and evaluated for an allergic reaction.    If you develop any concerning symptoms, especially feeling like your throat is closing, shortness of breath, swelling of the face, lips or tongue then please return to the emergency room immediately for evaluation.    For the next 24 hours please take 1 pill (25 mg) of benadryl every 6 hours.  If you have mild symptoms such as itching, mild nausea you may take a second benadryl.   This will make you sleepy and you should not drive, operate heavy machinery or perform any task where if you were to fall asleep or make a slow decision it could cause harm to you or someone else.   You have also been given a prescription for 3 days of steroids.  Please take your first dose about 24 hours after you were given steroids in the emergency room.  Steroids may give you the feeling of being hot, increased appetite, and extra energy.    I have given you a prescription for two epi pens.  Please make sure you keep one with you at all times.  Please consider keeping two pills of benadryl with your epi pen and if you use it take the benadryl and call 9-11.  Please do not store epi-pens in your car or allow them to get very hot or cold as this can make them not work as well.

## 2019-06-30 NOTE — ED Triage Notes (Signed)
Pt arrived via [facility] from PCP [facility] Bee sting on left toe itching and redness all over body. Pt denies itching or swelling of throat.    18G LAC  Benadryl at home   125 mg  Solumedrol and .6 Epi Im at PCP 1426    Hx Bee sting before with no reaction

## 2019-12-20 ENCOUNTER — Other Ambulatory Visit: Payer: Self-pay | Admitting: Family Medicine

## 2019-12-20 DIAGNOSIS — Z1231 Encounter for screening mammogram for malignant neoplasm of breast: Secondary | ICD-10-CM

## 2020-01-09 ENCOUNTER — Ambulatory Visit
Admission: RE | Admit: 2020-01-09 | Discharge: 2020-01-09 | Disposition: A | Discharge status: Home / Self Care | Payer: BC Managed Care – PPO | Source: Ambulatory Visit | Attending: Family Medicine | Admitting: Family Medicine

## 2020-01-09 ENCOUNTER — Other Ambulatory Visit: Payer: Self-pay

## 2020-01-09 DIAGNOSIS — Z1231 Encounter for screening mammogram for malignant neoplasm of breast: Secondary | ICD-10-CM

## 2020-02-07 ENCOUNTER — Other Ambulatory Visit: Payer: Self-pay

## 2020-02-07 ENCOUNTER — Other Ambulatory Visit: Payer: BC Managed Care – PPO

## 2020-02-07 DIAGNOSIS — Z20822 Contact with and (suspected) exposure to covid-19: Secondary | ICD-10-CM

## 2020-02-09 LAB — SARS-COV-2, NAA 2 DAY TAT

## 2020-02-09 LAB — NOVEL CORONAVIRUS, NAA: SARS-CoV-2, NAA: NOT DETECTED

## 2021-01-22 ENCOUNTER — Other Ambulatory Visit: Payer: Self-pay | Admitting: Family Medicine

## 2021-01-22 DIAGNOSIS — Z1231 Encounter for screening mammogram for malignant neoplasm of breast: Secondary | ICD-10-CM

## 2021-01-24 ENCOUNTER — Other Ambulatory Visit: Payer: Self-pay

## 2021-01-24 ENCOUNTER — Ambulatory Visit
Admission: RE | Admit: 2021-01-24 | Discharge: 2021-01-24 | Disposition: A | Discharge status: Home / Self Care | Payer: BC Managed Care – PPO | Source: Ambulatory Visit | Attending: Family Medicine | Admitting: Family Medicine

## 2021-01-24 ENCOUNTER — Encounter (HOSPITAL_BASED_OUTPATIENT_CLINIC_OR_DEPARTMENT_OTHER): Payer: Self-pay | Admitting: Emergency Medicine

## 2021-01-24 ENCOUNTER — Emergency Department (HOSPITAL_BASED_OUTPATIENT_CLINIC_OR_DEPARTMENT_OTHER): Payer: BC Managed Care – PPO | Admitting: Radiology

## 2021-01-24 DIAGNOSIS — W5501XA Bitten by cat, initial encounter: Secondary | ICD-10-CM | POA: Diagnosis not present

## 2021-01-24 DIAGNOSIS — S61250A Open bite of right index finger without damage to nail, initial encounter: Secondary | ICD-10-CM | POA: Diagnosis not present

## 2021-01-24 DIAGNOSIS — S6991XA Unspecified injury of right wrist, hand and finger(s), initial encounter: Secondary | ICD-10-CM | POA: Diagnosis present

## 2021-01-24 DIAGNOSIS — Z23 Encounter for immunization: Secondary | ICD-10-CM | POA: Diagnosis not present

## 2021-01-24 DIAGNOSIS — Z1231 Encounter for screening mammogram for malignant neoplasm of breast: Secondary | ICD-10-CM

## 2021-01-24 MED ORDER — TETANUS-DIPHTH-ACELL PERTUSSIS 5-2.5-18.5 LF-MCG/0.5 IM SUSY
0.5000 mL | PREFILLED_SYRINGE | Freq: Once | INTRAMUSCULAR | Status: AC
  Administered 2021-01-24: 0.5 mL via INTRAMUSCULAR
  Filled 2021-01-24: qty 0.5

## 2021-01-24 NOTE — ED Triage Notes (Signed)
Pt arrives pov with c/o cat bite to right index finger. Pt endorses No concern for rabies

## 2021-01-25 ENCOUNTER — Emergency Department (HOSPITAL_BASED_OUTPATIENT_CLINIC_OR_DEPARTMENT_OTHER)
Admission: EM | Admit: 2021-01-25 | Discharge: 2021-01-25 | Disposition: A | Discharge status: Home / Self Care | Payer: BC Managed Care – PPO | Attending: Emergency Medicine | Admitting: Emergency Medicine

## 2021-01-25 DIAGNOSIS — S61250A Open bite of right index finger without damage to nail, initial encounter: Secondary | ICD-10-CM | POA: Diagnosis not present

## 2021-01-25 DIAGNOSIS — W5501XA Bitten by cat, initial encounter: Secondary | ICD-10-CM

## 2021-01-25 MED ORDER — AMOXICILLIN-POT CLAVULANATE 875-125 MG PO TABS: 1.0000 | ORAL_TABLET | Freq: Two times a day (BID) | ORAL | 0 refills | Status: AC

## 2021-01-25 MED ORDER — AMOXICILLIN-POT CLAVULANATE 875-125 MG PO TABS
1.0000 | ORAL_TABLET | Freq: Once | ORAL | Status: AC
  Administered 2021-01-25: 1 via ORAL
  Filled 2021-01-25: qty 1

## 2021-01-27 NOTE — ED Provider Notes (Signed)
[place] EMERGENCY DEPT Provider Note   CSN: [id number] Arrival date & time: 01/24/21  2003     History  Chief Complaint  Patient presents with   Animal Bite    Ann Singh is a 56 y.o. female.  Patient states that she has a feral cat in her barn and she went to help it get some medical care and he got mad and scratched and bit her right pointer finger.  This happened earlier tonight.  She is already had a tetanus shot here.  She states that the animal is in animal control and they are watching it for 10 hours.  It is considered a provoked attack.  They do not think that the animal has rabies.   Animal Bite     Home Medications Prior to Admission medications   Medication Sig Start Date End Date Taking? Authorizing Provider  amoxicillin-clavulanate (AUGMENTIN) 875-125 MG tablet Take 1 tablet by mouth 2 (two) times daily for 10 days. One po bid x 7 days 01/25/21 02/04/21 Yes Zianne Schubring, Corene Cornea, MD  Biotin 1000 MCG CHEW Chew by mouth.    [provider]  EPINEPHrine 0.3 mg/0.3 mL IJ SOAJ injection Inject one device into the side of the thigh for severe allergic reactions.  May repeat with second device in 5-10 minutes if needed.  After using call 9-11. 06/30/19   Lorin Glass, PA-C  Multiple Vitamin (MULTIVITAMIN WITH MINERALS) TABS tablet Take 1 tablet by mouth daily.    [provider]  vitamin C (ASCORBIC ACID) 500 MG tablet Take 500 mg by mouth daily.    [provider]      Allergies    Patient has no known allergies.    Review of Systems   Review of Systems  Physical Exam Updated Vital Signs BP (!) 150/100 (BP Location: Right Arm)    Pulse 89    Temp 98.4 F (36.9 C) (Oral)    Resp 20    Ht 5\' 10"  (1.778 m)    Wt 88.5 kg    SpO2 96%    BMI 27.98 kg/m  Physical Exam Vitals and nursing note reviewed.  Constitutional:      Appearance: She is well-developed.  HENT:     Head: Normocephalic and atraumatic.      Mouth/Throat:     Mouth: Mucous membranes are moist.     Pharynx: Oropharynx is clear.  Eyes:     Pupils: Pupils are equal, round, and reactive to light.  Cardiovascular:     Rate and Rhythm: Normal rate and regular rhythm.  Pulmonary:     Effort: Pulmonary effort is normal. No respiratory distress.     Breath sounds: No stridor.  Abdominal:     General: There is no distension.  Musculoskeletal:     Cervical back: Normal range of motion.  Skin:    General: Skin is warm and dry.     Comments: Multiple superficial appearing wounds to her right index finger volar surface with mild erythema.  None the significantly deep.  She does not want to get an x-ray at this time.  No fevers.  Just happened earlier tonight.  Neurological:     General: No focal deficit present.     Mental Status: She is alert.    ED Results / Procedures / Treatments   Labs (all labs ordered are listed, but only abnormal results are displayed) Labs Reviewed - No data to display  EKG None  Radiology No  results found.  Procedures Procedures    Medications Ordered in ED Medications  Tdap (BOOSTRIX) injection 0.5 mL (0.5 mLs Intramuscular Given 01/24/21 2148)  amoxicillin-clavulanate (AUGMENTIN) 875-125 MG per tablet 1 tablet (1 tablet Oral Given 01/25/21 1594)    ED Course/ Medical Decision Making/ A&P                           Medical Decision Making  I discussed with her the possibility and the likelihood of deep-seated infection.  Antibiotic started but understands that there is a significant chance that she could still have an infection that she would need to be admitted to the hospital and possibly get surgery for.  She understands this.  She will keep a close eye on and keep the area clean and dry.  No indication for rabies vaccination at this time.   Final Clinical Impression(s) / ED Diagnoses Final diagnoses:  Cat bite, initial encounter    Rx / DC Orders ED Discharge Orders           Ordered    amoxicillin-clavulanate (AUGMENTIN) 875-125 MG tablet  2 times daily        01/25/21 0623              Sherald Balbuena, Corene Cornea, MD 01/27/21 [phone]

## 2022-01-12 ENCOUNTER — Other Ambulatory Visit: Payer: Self-pay | Admitting: Family Medicine

## 2022-01-12 DIAGNOSIS — Z1231 Encounter for screening mammogram for malignant neoplasm of breast: Secondary | ICD-10-CM

## 2022-03-05 ENCOUNTER — Ambulatory Visit
Admission: RE | Admit: 2022-03-05 | Discharge: 2022-03-05 | Disposition: A | Discharge status: Home / Self Care | Payer: No Typology Code available for payment source | Source: Ambulatory Visit | Attending: Family Medicine | Admitting: Family Medicine

## 2022-03-05 DIAGNOSIS — Z1231 Encounter for screening mammogram for malignant neoplasm of breast: Secondary | ICD-10-CM

## 2023-03-31 ENCOUNTER — Other Ambulatory Visit: Payer: Self-pay | Admitting: Family Medicine

## 2023-03-31 DIAGNOSIS — Z1231 Encounter for screening mammogram for malignant neoplasm of breast: Secondary | ICD-10-CM

## 2023-04-02 ENCOUNTER — Ambulatory Visit
Admission: RE | Admit: 2023-04-02 | Discharge: 2023-04-02 | Disposition: A | Discharge status: Home / Self Care | Source: Ambulatory Visit | Attending: Family Medicine | Admitting: Family Medicine

## 2023-04-02 DIAGNOSIS — Z1231 Encounter for screening mammogram for malignant neoplasm of breast: Secondary | ICD-10-CM

## 2023-07-15 IMAGING — MG MM DIGITAL SCREENING BILAT W/ TOMO AND CAD
6 of 10 series · 6 of 30 positions shown · non-contrast
Comparison: Previous exam(s).

CLINICAL DATA: Screening.

EXAM:
DIGITAL SCREENING BILATERAL MAMMOGRAM WITH TOMOSYNTHESIS AND CAD
TECHNIQUE: Bilateral screening digital craniocaudal and mediolateral oblique
mammograms were obtained. Bilateral screening digital breast
tomosynthesis was performed. The images were evaluated with
computer-aided detection.

[L CC synth-2D]
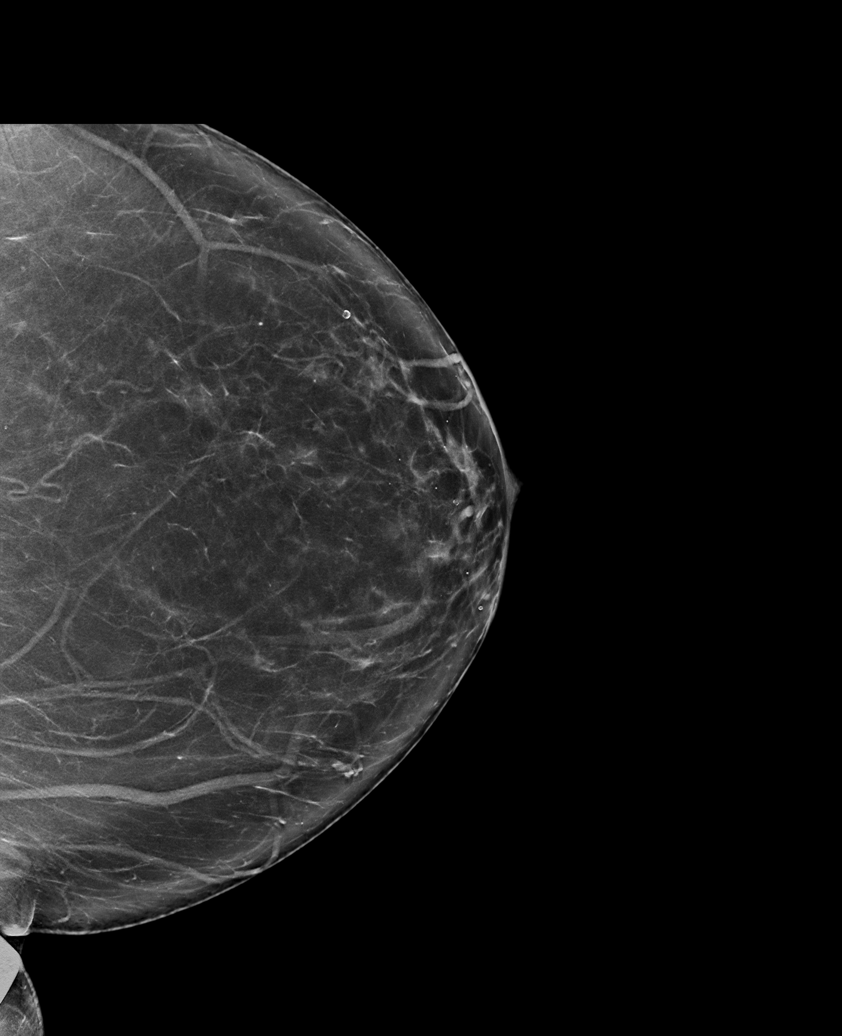

[R CC synth-2D (1 of 2)]
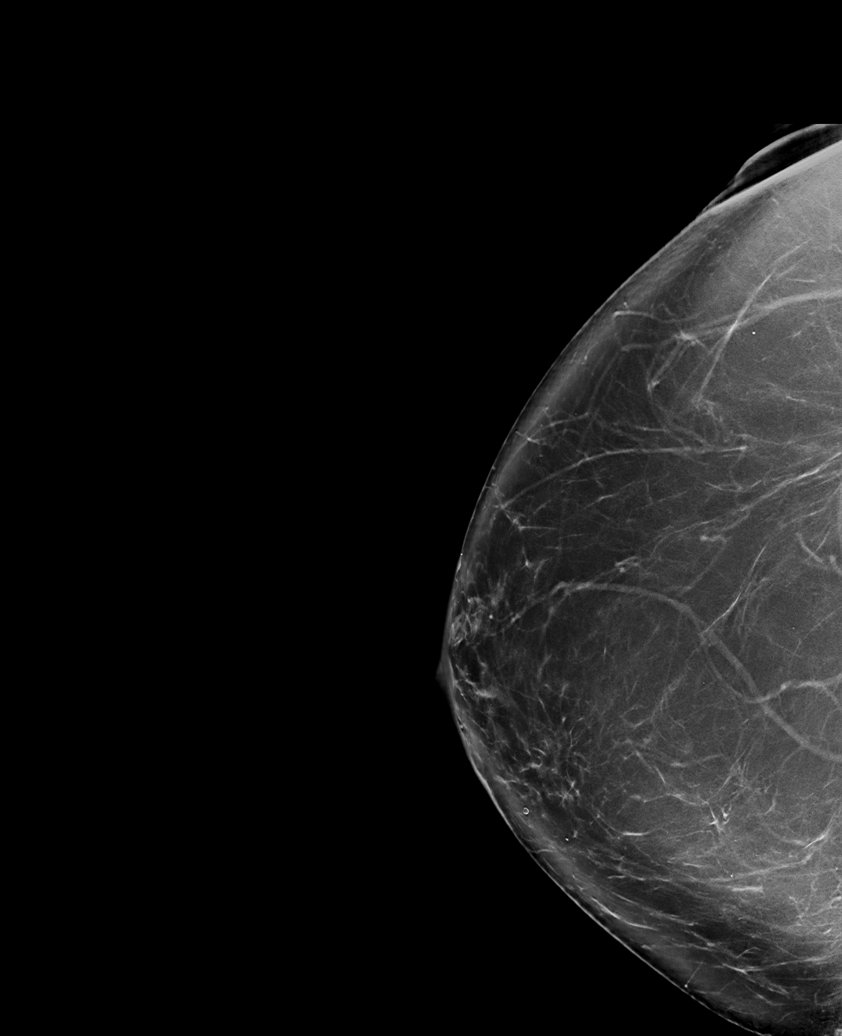

[R CC synth-2D (2 of 2)]
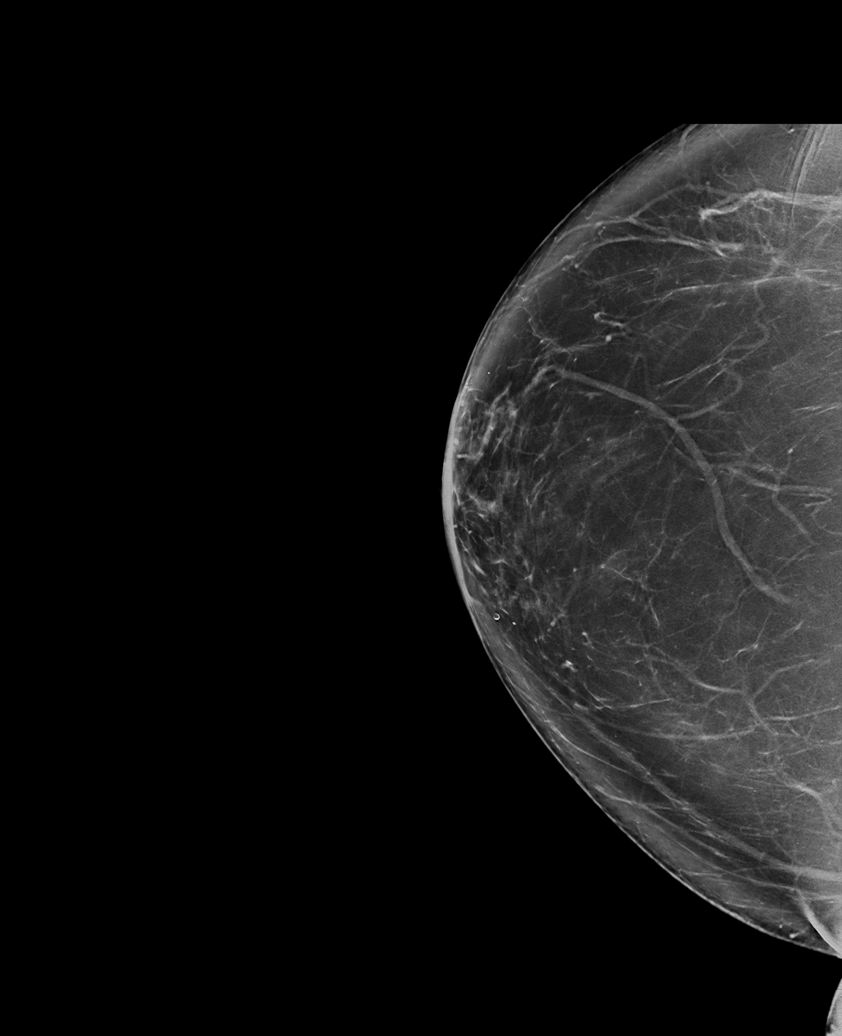

[R MLO synth-2D]
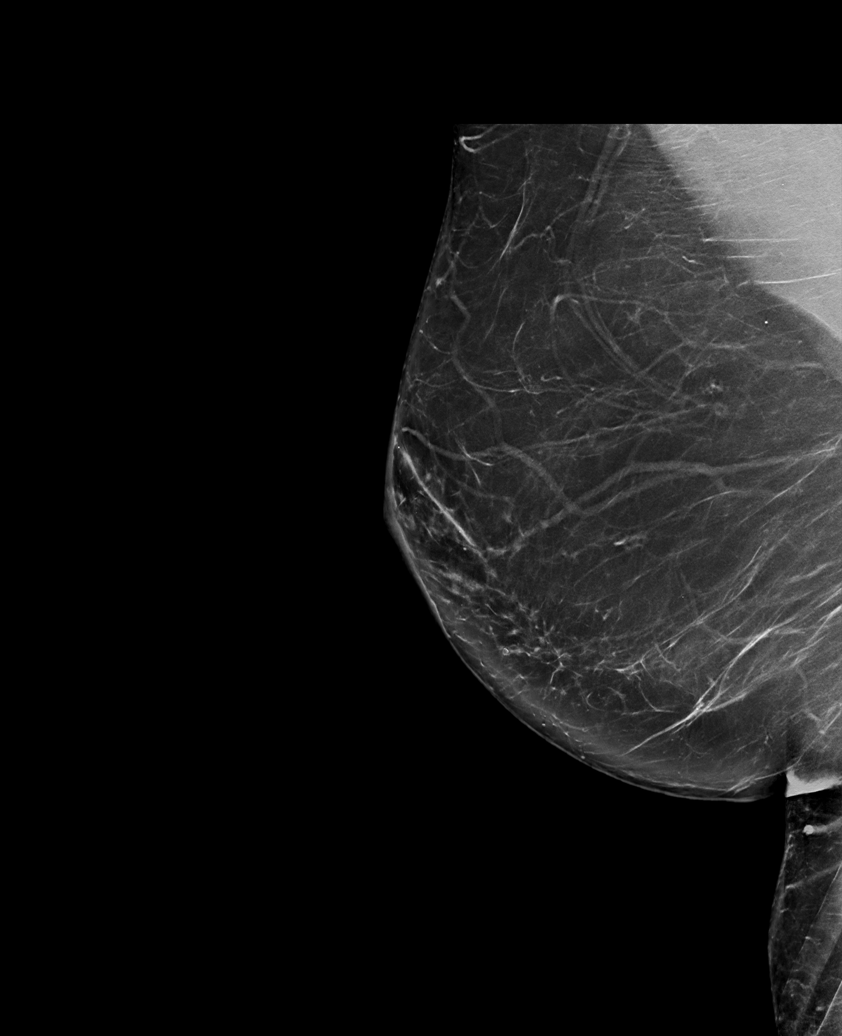

[L MLO synth-2D]
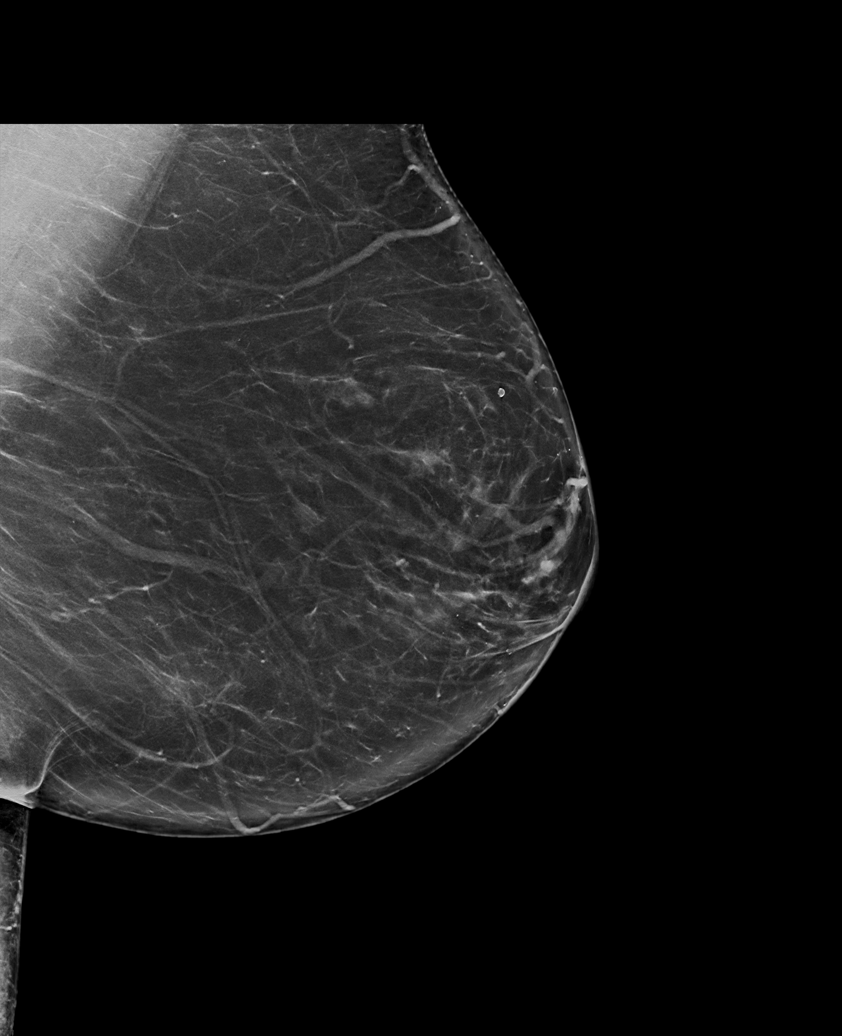

[R CC tomo · tomo slice 46/91.0]
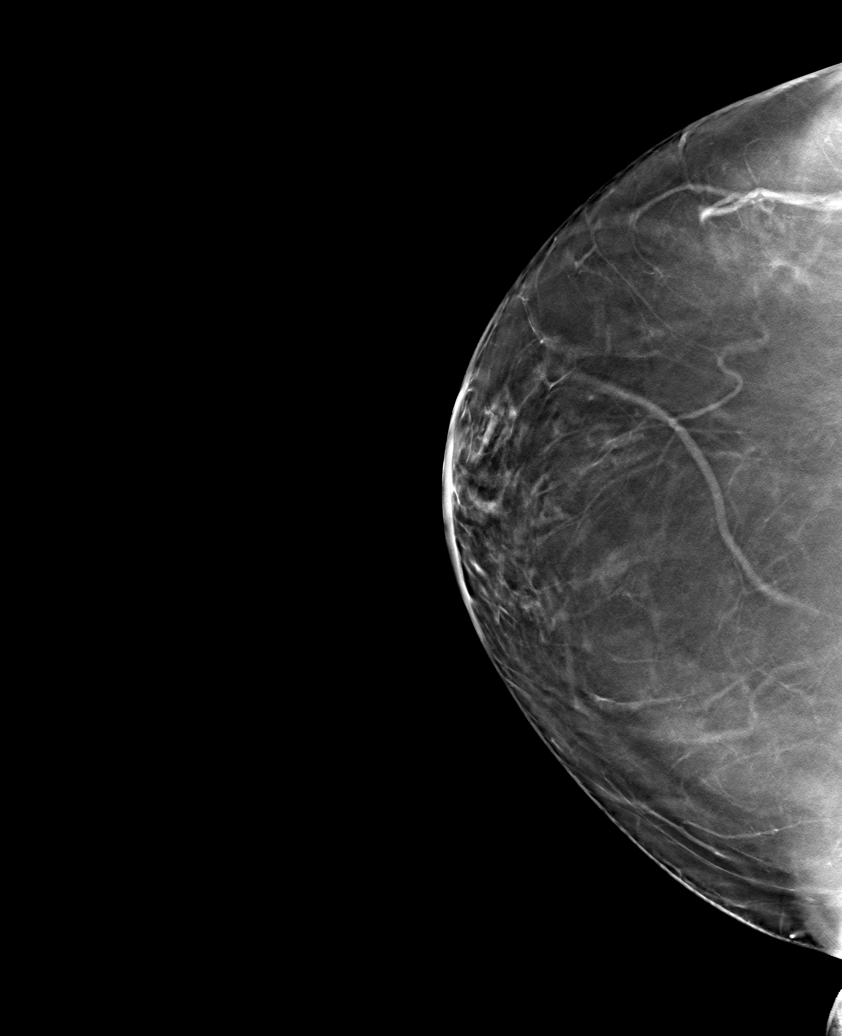

[6 of 30 positions shown; findings below may reference images not displayed]

ACR Breast Density Category b: There are scattered areas of
fibroglandular density.
FINDINGS: There are no findings suspicious for malignancy.
IMPRESSION: No mammographic evidence of malignancy. A result letter of this
screening mammogram will be mailed directly to the patient.

RECOMMENDATION:
Screening mammogram in one year. (Code:51-O-LD2)

BI-RADS CATEGORY  1: Negative.

## 2023-10-25 ENCOUNTER — Telehealth

## 2023-10-25 ENCOUNTER — Other Ambulatory Visit: Payer: Self-pay

## 2023-10-25 ENCOUNTER — Ambulatory Visit: Payer: Self-pay

## 2023-10-25 ENCOUNTER — Encounter (HOSPITAL_BASED_OUTPATIENT_CLINIC_OR_DEPARTMENT_OTHER): Payer: Self-pay | Admitting: Urology

## 2023-10-25 ENCOUNTER — Emergency Department (HOSPITAL_BASED_OUTPATIENT_CLINIC_OR_DEPARTMENT_OTHER): Admission: EM | Admit: 2023-10-25 | Discharge: 2023-10-25 | Disposition: A | Discharge status: Home / Self Care

## 2023-10-25 DIAGNOSIS — W57XXXA Bitten or stung by nonvenomous insect and other nonvenomous arthropods, initial encounter: Secondary | ICD-10-CM | POA: Diagnosis not present

## 2023-10-25 DIAGNOSIS — M7989 Other specified soft tissue disorders: Secondary | ICD-10-CM | POA: Diagnosis present

## 2023-10-25 LAB — CBC
HCT: 43 % (ref 36.0–46.0)
Hemoglobin: 14.3 g/dL (ref 12.0–15.0)
MCH: 29.7 pg (ref 26.0–34.0)
MCHC: 33.3 g/dL (ref 30.0–36.0)
MCV: 89.4 fL (ref 80.0–100.0)
Platelets: 311 K/uL (ref 150–400)
RBC: 4.81 MIL/uL (ref 3.87–5.11)
RDW: 13.3 % (ref 11.5–15.5)
WBC: 8.6 K/uL (ref 4.0–10.5)
nRBC: 0 % (ref 0.0–0.2)

## 2023-10-25 LAB — BASIC METABOLIC PANEL WITH GFR
Anion gap: 13 (ref 5–15)
BUN: 10 mg/dL (ref 6–20)
CO2: 23 mmol/L (ref 22–32)
Calcium: 9.8 mg/dL (ref 8.9–10.3)
Chloride: 101 mmol/L (ref 98–111)
Creatinine, Ser: 0.75 mg/dL (ref 0.44–1.00)
GFR, Estimated: >60 mL/min (ref >60)
Glucose, Bld: 103 mg/dL — ABNORMAL HIGH (ref 70–99)
Potassium: 3.8 mmol/L (ref 3.5–5.1)
Sodium: 137 mmol/L (ref 135–145)

## 2023-10-25 MED ORDER — CEPHALEXIN 500 MG PO CAPS: 500.0000 mg | ORAL_CAPSULE | Freq: Four times a day (QID) | ORAL | 0 refills | Status: AC

## 2023-10-25 MED ORDER — EPINEPHRINE 0.3 MG/0.3ML IJ SOAJ: 0.3000 mg | INTRAMUSCULAR | 0 refills | Status: AC | PRN

## 2023-10-25 MED ORDER — METHYLPREDNISOLONE SODIUM SUCC 125 MG IJ SOLR
125.0000 mg | Freq: Once | INTRAMUSCULAR | Status: AC
  Administered 2023-10-25: 125 mg via INTRAVENOUS
  Filled 2023-10-25: qty 2

## 2023-10-25 MED ORDER — SODIUM CHLORIDE 0.9 % IV BOLUS
1000.0000 mL | Freq: Once | INTRAVENOUS | Status: AC
  Administered 2023-10-25: 1000 mL via INTRAVENOUS

## 2023-10-25 MED ORDER — FAMOTIDINE 20 MG PO TABS: 20.0000 mg | ORAL_TABLET | Freq: Two times a day (BID) | ORAL | 0 refills | Status: AC

## 2023-10-25 MED ORDER — DIPHENHYDRAMINE HCL 50 MG/ML IJ SOLN
25.0000 mg | Freq: Once | INTRAMUSCULAR | Status: AC
  Administered 2023-10-25: 25 mg via INTRAVENOUS
  Filled 2023-10-25: qty 1

## 2023-10-25 MED ORDER — PREDNISONE 10 MG PO TABS: 30.0000 mg | ORAL_TABLET | Freq: Every day | ORAL | 0 refills | Status: AC

## 2023-10-25 MED ORDER — FAMOTIDINE 20 MG PO TABS
20.0000 mg | ORAL_TABLET | Freq: Once | ORAL | Status: AC
  Administered 2023-10-25: 20 mg via ORAL
  Filled 2023-10-25: qty 1

## 2023-10-25 MED ORDER — CEPHALEXIN 250 MG PO CAPS
500.0000 mg | ORAL_CAPSULE | Freq: Once | ORAL | Status: AC
  Administered 2023-10-25: 500 mg via ORAL
  Filled 2023-10-25: qty 2

## 2023-10-25 NOTE — ED Notes (Signed)
 ED Provider at bedside.

## 2023-10-25 NOTE — Telephone Encounter (Signed)
 FYI Only or Action Required?: FYI only for provider.  Patient was last seen in primary care on not a [facility] pt.  Called Nurse Triage reporting Insect Bite. - wasp  Symptoms began several days ago.  Interventions attempted: OTC medications: Benadryl and hydrocortisone ointment.  Symptoms are: unchanged.  Triage Disposition: See Physician Within 24 Hours  Patient/caregiver understands and will follow disposition?: Yes                       Copied from CRM #[id number]. Topic: Clinical - Red Word Triage >> Oct 25, 2023  3:06 PM Chiquita SQUIBB wrote: Red Word that prompted transfer to Nurse Triage: Patient is calling in wanting to switch to [facility] PCP new patient. Patient is experiencing a bee sting and is having an allergic reaction, stated her whole hand is now swelling into her fingers. Patient stated her original PCP is not doing anything to help her. Reason for Disposition  [1] Red or very tender (to touch) area AND [2] getting larger over 48 hours after the sting  Answer Assessment - Initial Assessment Questions 1. TYPE: What type of sting was it? (e.g., bee, yellow jacket, unknown)      wasp 2. ONSET: When did it occur?      Saturday - 5 pm 3. LOCATION: Where is the sting located?  How many stings?     Right hand up her wrist 4. SWELLING SIZE: How big is the swelling? (e.g., inches or cm)     Mickey mouse hands 5. REDNESS: Is the area red or pink? If Yes, ask: What size is area of redness? (e.g., inches or cm). When did the redness start?     Red and warm 6. PAIN: Is there any pain? If Yes, ask: How bad is it?  (Scale 0-10; or none, mild, moderate, severe)     no 7. ITCHING: Is there any itching? If Yes, ask: How bad is it?      Knuckles itch 8. RESPIRATORY DISTRESS: Describe your breathing.     no 9. PRIOR REACTIONS: Have you had any severe allergic reactions to stings in the past? If Yes, ask: What happened?     Yes - much worse  with bee sting  Protocols used: Bee or Yellow Jacket Sting-A-AH

## 2023-10-25 NOTE — ED Provider Notes (Signed)
 [place] EMERGENCY DEPARTMENT AT [facility] Provider Note   CSN: [id number] Arrival date & time: 10/25/23  1925     Patient presents with: Insect Bite  HPI Ann Singh is a 58 y.o. female presenting for wasp sting.  She states this occurred on Saturday.  Lesion is located in between the 3rd and 4th knuckles on the right hand.  She states she has been taking Benadryl which she last took this morning and using ice and the swelling appears to be worsening now she is noting some swelling in her hand and up her wrist.  She mentioned that she had a rash reaction to a bee in 2022 and was hospitalized at that time.  Denies trouble breathing, throat closing sensation or wheezing.   HPI     Prior to Admission medications   Medication Sig Start Date End Date Taking? Authorizing Provider  cephALEXin (KEFLEX) 500 MG capsule Take 1 capsule (500 mg total) by mouth 4 (four) times daily for 5 days. 10/25/23 10/30/23 Yes Lang Norleen POUR, PA-C  EPINEPHrine  0.3 mg/0.3 mL IJ SOAJ injection Inject 0.3 mg into the muscle as needed for anaphylaxis. 10/25/23  Yes Triniti Gruetzmacher K, PA-C  famotidine  (PEPCID ) 20 MG tablet Take 1 tablet (20 mg total) by mouth 2 (two) times daily for 5 days. 10/25/23 10/30/23 Yes Kalayna Noy K, PA-C  predniSONE  (DELTASONE ) 10 MG tablet Take 3 tablets (30 mg total) by mouth daily for 4 days. 10/25/23 10/29/23 Yes Kristelle Cavallaro K, PA-C  Biotin 1000 MCG CHEW Chew by mouth.    [provider]  Multiple Vitamin (MULTIVITAMIN WITH MINERALS) TABS tablet Take 1 tablet by mouth daily.    [provider]  vitamin C (ASCORBIC ACID) 500 MG tablet Take 500 mg by mouth daily.    [provider]    Allergies: Patient has no known allergies.    Review of Systems See HPI  Updated Vital Signs BP (!) 153/102 (BP Location: Right Arm)   Pulse 95   Temp (!) 97.4 F (36.3 C)   Resp 19   Ht 5' 10 (1.778 m)   Wt 88.5 kg   SpO2 96%   BMI 27.99  kg/m   Physical Exam Vitals and nursing note reviewed.  HENT:     Head: Normocephalic and atraumatic.     Mouth/Throat:     Mouth: Mucous membranes are moist.  Eyes:     General:        Right eye: No discharge.        Left eye: No discharge.     Conjunctiva/sclera: Conjunctivae normal.  Cardiovascular:     Rate and Rhythm: Normal rate and regular rhythm.     Pulses: Normal pulses.     Heart sounds: Normal heart sounds.  Pulmonary:     Effort: Pulmonary effort is normal.     Breath sounds: Normal breath sounds.  Abdominal:     General: Abdomen is flat.     Palpations: Abdomen is soft.  Skin:    General: Skin is warm and dry.     Comments: Extensive swelling and some erythema noted at the base of the 3rd and 4th digits of the right hand.  The swelling is primarily on the dorsal aspect of the hand and extends to the wrist.  Hand is warm to touch.  Radial pulses 2+.  Neurological:     General: No focal deficit present.  Psychiatric:        Mood  and Affect: Mood normal.       (all labs ordered are listed, but only abnormal results are displayed) Labs Reviewed  BASIC METABOLIC PANEL WITH GFR  CBC    EKG: None  Radiology: No results found.   Procedures   Medications Ordered in the ED  cephALEXin (KEFLEX) capsule 500 mg (has no administration in time range)  sodium chloride 0.9 % bolus 1,000 mL (1,000 mLs Intravenous New Bag/Given 10/25/23 2119)  diphenhydrAMINE (BENADRYL) injection 25 mg (25 mg Intravenous Given 10/25/23 2117)  methylPREDNISolone sodium succinate (SOLU-MEDROL) 125 mg/2 mL injection 125 mg (125 mg Intravenous Given 10/25/23 2116)  famotidine  (PEPCID ) tablet 20 mg (20 mg Oral Given 10/25/23 2103)    Clinical Course as of 10/25/23 2132  Mon Oct 25, 2023  2123 Stung by wasp 3 days ago. Needed IV meds. Possible cellulitis. Will send home with kelfex and epi pen after medications. Observation after meds. Anticipate home.  [CB]    Clinical Course User  Index [CB] Beola Terrall RAMAN, PA-C                                 Medical Decision Making Amount and/or Complexity of Data Reviewed Labs: ordered.  Risk Prescription drug management.   58 year old well-appearing female presenting for wasp sting and right hand swelling.  Exam notable for extensive swelling erythema and warmth in the right dorsal aspect of the hand.  Suspect this is likely allergic reaction but cannot rule out cellulitis as well.  Given that it has worsened over the last couple days and that she has been hospitalized in the past for an allergic reaction to a bee sting felt it warranted IV medications.  She does not appear to be in any respiratory distress and exam did not suggest concern for upper airway obstruction thus will hold on administering EpiPen  at this time.  Plan will be to reassess after IV medications.  If she is feeling better after observation anticipate discharge.  I have already sent prednisone  famotidine  and Keflex along with an EpiPen  to her pharmacy.  Signed out patient to PA [employer].     Final diagnoses:  Hand swelling  Insect bite, unspecified site, initial encounter    ED Discharge Orders          Ordered    cephALEXin (KEFLEX) 500 MG capsule  4 times daily        10/25/23 2126    predniSONE  (DELTASONE ) 10 MG tablet  Daily        10/25/23 2126    famotidine  (PEPCID ) 20 MG tablet  2 times daily        10/25/23 2126    EPINEPHrine  0.3 mg/0.3 mL IJ SOAJ injection  As needed        10/25/23 2126               Lang Norleen POUR, PA-C 10/25/23 2134    Neysa Caron PARAS, DO 10/25/23 2357

## 2023-10-25 NOTE — ED Triage Notes (Signed)
 Pt states stung by wasp on Saturday to right hand  Swelling to hand noted   Taking benadryl and using ice   Had reaction to bee in 2022 and was hospitalized  Epi pen is expired

## 2023-10-25 NOTE — ED Provider Notes (Signed)
  Physical Exam  BP (!) 153/102 (BP Location: Right Arm)   Pulse 95   Temp (!) 97.4 F (36.3 C)   Resp 19   Ht 5' 10 (1.778 m)   Wt 88.5 kg   SpO2 96%   BMI 27.99 kg/m   Physical Exam Vitals and nursing note reviewed.  Constitutional:      Appearance: Normal appearance.  Eyes:     General:        Right eye: No discharge.        Left eye: No discharge.     Extraocular Movements: Extraocular movements intact.     Conjunctiva/sclera: Conjunctivae normal.  Cardiovascular:     Rate and Rhythm: Normal rate and regular rhythm.  Pulmonary:     Effort: Pulmonary effort is normal. No respiratory distress.  Neurological:     General: No focal deficit present.     Mental Status: She is alert. Mental status is at baseline.     Procedures  Procedures  ED Course / MDM   Clinical Course as of 10/25/23 2217  Mon Oct 25, 2023  2123 Stung by wasp 3 days ago. Needed IV meds. Possible cellulitis. Will send home with kelfex and epi pen after medications. Observation after meds. Anticipate home.  [CB]    Clinical Course User Index [CB] Beola Terrall RAMAN, PA-C   Medical Decision Making Amount and/or Complexity of Data Reviewed Labs: ordered.  Risk Prescription drug management.  At time handoff, awaiting for medications finished, patient being sent home with steroid, Pepcid , Keflex.  For concerns for possible cellulitis underlying allergic reaction.  Patient labs were unremarkable.  On reevaluation she was noted to improve with swelling also improving.  Will have her continue to follow with PCP.  Patient vital signs have remained stable throughout the course of patient's time in the ED. Low suspicion for any other emergent pathology at this time. I believe this patient is safe to be discharged. Provided strict return to ER precautions. Patient expressed agreement and understanding of plan. All questions were answered.       Beola Terrall RAMAN, PA-C 10/25/23 2244    Neysa Caron PARAS, DO 10/25/23 2357

## 2023-10-25 NOTE — Discharge Instructions (Signed)
 Evaluation today revealed that you likely had an allergic reaction to the wasp being in your right hand.  There is also some concern for possible superimposed cellulitis.  I am sending Keflex which is an antibiotic for treatment for cellulitis.  I also sent a 4-day course of prednisone  and Pepcid  to your pharmacy as well along with a EpiPen  to be used as needed.  If your swelling worsens, you have any trouble breathing or throat closing sensation or any other concerning symptom please return to the ED for further evaluation.  Otherwise please follow-up with your PCP.
